# Patient Record
Sex: Female | Born: 1995 | Race: White | Hispanic: No | Marital: Single | Smoking: Never smoker
Health system: Southern US, Community
[De-identification: ages and names within clinical notes are randomized; demographics above are authoritative.]

## PROBLEM LIST (undated history)

## (undated) DIAGNOSIS — N200 Calculus of kidney: Secondary | ICD-10-CM

## (undated) HISTORY — PX: CLEFT PALATE REPAIR: SUR1165

## (undated) HISTORY — PX: SKIN GRAFT: SHX250

## (undated) HISTORY — PX: INNER EAR SURGERY: SHX679

---

## 2020-02-02 ENCOUNTER — Other Ambulatory Visit: Payer: Self-pay

## 2020-02-02 ENCOUNTER — Emergency Department (HOSPITAL_BASED_OUTPATIENT_CLINIC_OR_DEPARTMENT_OTHER): Payer: No Typology Code available for payment source

## 2020-02-02 ENCOUNTER — Encounter (HOSPITAL_BASED_OUTPATIENT_CLINIC_OR_DEPARTMENT_OTHER): Payer: Self-pay | Admitting: Emergency Medicine

## 2020-02-02 ENCOUNTER — Emergency Department (HOSPITAL_BASED_OUTPATIENT_CLINIC_OR_DEPARTMENT_OTHER)
Admission: EM | Admit: 2020-02-02 | Discharge: 2020-02-02 | Disposition: A | Discharge status: Home / Self Care | Payer: No Typology Code available for payment source | Attending: Emergency Medicine | Admitting: Emergency Medicine

## 2020-02-02 DIAGNOSIS — N200 Calculus of kidney: Secondary | ICD-10-CM | POA: Insufficient documentation

## 2020-02-02 DIAGNOSIS — R1031 Right lower quadrant pain: Secondary | ICD-10-CM | POA: Insufficient documentation

## 2020-02-02 DIAGNOSIS — R109 Unspecified abdominal pain: Secondary | ICD-10-CM | POA: Diagnosis present

## 2020-02-02 HISTORY — DX: Calculus of kidney: N20.0

## 2020-02-02 LAB — URINALYSIS, ROUTINE W REFLEX MICROSCOPIC
Bilirubin Urine: NEGATIVE
Glucose, UA: NEGATIVE mg/dL
Ketones, ur: 15 mg/dL — AB
Leukocytes,Ua: NEGATIVE
Nitrite: NEGATIVE
Protein, ur: NEGATIVE mg/dL
Specific Gravity, Urine: 1.02 (ref 1.005–1.030)
pH: 7 (ref 5.0–8.0)

## 2020-02-02 LAB — COMPREHENSIVE METABOLIC PANEL
ALT: 13 U/L (ref 0–44)
AST: 20 U/L (ref 15–41)
Albumin: 4.4 g/dL (ref 3.5–5.0)
Alkaline Phosphatase: 63 U/L (ref 38–126)
Anion gap: 9 (ref 5–15)
BUN: 6 mg/dL (ref 6–20)
CO2: 25 mmol/L (ref 22–32)
Calcium: 9.2 mg/dL (ref 8.9–10.3)
Chloride: 104 mmol/L (ref 98–111)
Creatinine, Ser: 0.67 mg/dL (ref 0.44–1.00)
GFR calc Af Amer: >60 mL/min (ref >60)
GFR calc non Af Amer: >60 mL/min (ref >60)
Glucose, Bld: 120 mg/dL — ABNORMAL HIGH (ref 70–99)
Potassium: 4.1 mmol/L (ref 3.5–5.1)
Sodium: 138 mmol/L (ref 135–145)
Total Bilirubin: 0.5 mg/dL (ref 0.3–1.2)
Total Protein: 7.4 g/dL (ref 6.5–8.1)

## 2020-02-02 LAB — CBC
HCT: 38.9 % (ref 36.0–46.0)
Hemoglobin: 12.3 g/dL (ref 12.0–15.0)
MCH: 26.5 pg (ref 26.0–34.0)
MCHC: 31.6 g/dL (ref 30.0–36.0)
MCV: 83.8 fL (ref 80.0–100.0)
Platelets: 296 10*3/uL (ref 150–400)
RBC: 4.64 MIL/uL (ref 3.87–5.11)
RDW: 14.7 % (ref 11.5–15.5)
WBC: 12.7 10*3/uL — ABNORMAL HIGH (ref 4.0–10.5)
nRBC: 0 % (ref 0.0–0.2)

## 2020-02-02 LAB — URINALYSIS, MICROSCOPIC (REFLEX): RBC / HPF: >50 RBC/hpf (ref 0–5)

## 2020-02-02 LAB — LIPASE, BLOOD: Lipase: 30 U/L (ref 11–51)

## 2020-02-02 LAB — PREGNANCY, URINE: Preg Test, Ur: NEGATIVE

## 2020-02-02 MED ORDER — MORPHINE SULFATE (PF) 4 MG/ML IV SOLN
4.0000 mg | Freq: Once | INTRAVENOUS | Status: AC
  Administered 2020-02-02: 4 mg via INTRAVENOUS
  Filled 2020-02-02: qty 1

## 2020-02-02 MED ORDER — MORPHINE SULFATE 15 MG PO TABS: 7.5000 mg | ORAL_TABLET | ORAL | 0 refills | Status: AC | PRN

## 2020-02-02 MED ORDER — ONDANSETRON HCL 4 MG/2ML IJ SOLN: 4.0000 mg | Freq: Once | INTRAMUSCULAR | Status: DC

## 2020-02-02 MED ORDER — TAMSULOSIN HCL 0.4 MG PO CAPS: 0.4000 mg | ORAL_CAPSULE | Freq: Every day | ORAL | 0 refills | Status: AC

## 2020-02-02 MED ORDER — SODIUM CHLORIDE 0.9 % IV BOLUS
1000.0000 mL | Freq: Once | INTRAVENOUS | Status: AC
  Administered 2020-02-02: 1000 mL via INTRAVENOUS

## 2020-02-02 MED ORDER — ONDANSETRON HCL 4 MG/2ML IJ SOLN
4.0000 mg | Freq: Once | INTRAMUSCULAR | Status: AC
  Administered 2020-02-02: 4 mg via INTRAVENOUS
  Filled 2020-02-02: qty 2

## 2020-02-02 MED ORDER — ONDANSETRON 4 MG PO TBDP: ORAL_TABLET | ORAL | 0 refills | Status: AC

## 2020-02-02 MED ORDER — KETOROLAC TROMETHAMINE 15 MG/ML IJ SOLN
15.0000 mg | Freq: Once | INTRAMUSCULAR | Status: AC
  Administered 2020-02-02: 15 mg via INTRAVENOUS
  Filled 2020-02-02: qty 1

## 2020-02-02 MED ORDER — ONDANSETRON HCL 4 MG/2ML IJ SOLN
4.0000 mg | Freq: Once | INTRAMUSCULAR | Status: AC | PRN
  Administered 2020-02-02: 4 mg via INTRAVENOUS
  Filled 2020-02-02: qty 2

## 2020-02-02 MED FILL — MORPHINE SULFATE IR 15 MG T: 15 | 3 days supply | Qty: 5 | Fill #0

## 2020-02-02 MED FILL — ONDANSETRON ODT 4 MG TABLET: 4 | 3 days supply | Qty: 20 | Fill #0

## 2020-02-02 MED FILL — TAMSULOSIN HCL 0.4 MG CAP: 0.4 | 30 days supply | Qty: 30 | Fill #0

## 2020-02-02 NOTE — ED Provider Notes (Signed)
[facility] EMERGENCY DEPARTMENT Provider Note   CSN: [id number] Arrival date & time: 02/02/20  1142     History Chief Complaint  Patient presents with  . Abdominal Pain    Leslie Meyer is a 24 y.o. female.  24 yo F with chief complaints of right lower quadrant abdominal pain.  Started this morning.  Constant unrelenting.  Patient has a history of kidney stones and thinks this feels similar.  Having some nausea and vomiting with it.  No fevers.  Has had 1 stone prior that she passed without intervention.  The history is provided by the patient.  Abdominal Pain Pain location:  RLQ Pain quality: cramping, sharp and shooting   Pain radiates to:  Does not radiate Pain severity:  Moderate Onset quality:  Sudden Duration:  1 day Timing:  Constant Progression:  Worsening Chronicity:  New Relieved by:  Nothing Worsened by:  Nothing Ineffective treatments:  None tried Associated symptoms: nausea and vomiting   Associated symptoms: no chest pain, no chills, no dysuria, no fever and no shortness of breath        Past Medical History:  Diagnosis Date  . Kidney stones     There are no problems to display for this patient.   Past Surgical History:  Procedure Laterality Date  . CLEFT PALATE REPAIR    . INNER EAR SURGERY    . SKIN GRAFT       OB History   No obstetric history on file.     No family history on file.  Social History   Tobacco Use  . Smoking status: Never Smoker  . Smokeless tobacco: Never Used  Substance Use Topics  . Alcohol use: Not Currently  . Drug use: Never    Home Medications Prior to Admission medications   Medication Sig Start Date End Date Taking? Authorizing Provider  morphine (MSIR) 15 MG tablet Take 0.5 tablets (7.5 mg total) by mouth every 4 (four) hours as needed for severe pain. 02/02/20   Melene Plan, DO  ondansetron (ZOFRAN ODT) 4 MG disintegrating tablet 4mg  ODT q4 hours prn nausea/vomit 02/02/20   02/04/20, DO    tamsulosin (FLOMAX) 0.4 MG CAPS capsule Take 1 capsule (0.4 mg total) by mouth daily after supper. 02/02/20   02/04/20, DO    Allergies    Lorabid [loracarbef] and Versed [midazolam]  Review of Systems   Review of Systems  Constitutional: Negative for chills and fever.  HENT: Negative for congestion and rhinorrhea.   Eyes: Negative for redness and visual disturbance.  Respiratory: Negative for shortness of breath and wheezing.   Cardiovascular: Negative for chest pain and palpitations.  Gastrointestinal: Positive for abdominal pain, nausea and vomiting.  Genitourinary: Negative for dysuria and urgency.  Musculoskeletal: Negative for arthralgias and myalgias.  Skin: Negative for pallor and wound.  Neurological: Negative for dizziness and headaches.    Physical Exam Updated Vital Signs BP 119/83 (BP Location: Right Arm)   Pulse 98   Temp 97.9 F (36.6 C) (Oral)   Resp 18   Ht 5\' 3"  (1.6 m)   Wt 79.9 kg   SpO2 100%   BMI 31.19 kg/m   Physical Exam Vitals and nursing note reviewed.  Constitutional:      General: She is not in acute distress.    Appearance: She is well-developed. She is not diaphoretic.  HENT:     Head: Normocephalic and atraumatic.  Eyes:     Pupils: Pupils are equal, round,  and reactive to light.  Cardiovascular:     Rate and Rhythm: Normal rate and regular rhythm.     Heart sounds: No murmur. No friction rub. No gallop.   Pulmonary:     Effort: Pulmonary effort is normal.     Breath sounds: No wheezing or rales.  Abdominal:     General: There is no distension.     Palpations: Abdomen is soft.     Tenderness: There is abdominal tenderness in the right lower quadrant. There is no guarding or rebound. Negative signs include Murphy's sign and Rovsing's sign.  Musculoskeletal:        General: No tenderness.     Cervical back: Normal range of motion and neck supple.  Skin:    General: Skin is warm and dry.  Neurological:     Mental Status: She is  alert and oriented to person, place, and time.  Psychiatric:        Behavior: Behavior normal.     ED Results / Procedures / Treatments   Labs (all labs ordered are listed, but only abnormal results are displayed) Labs Reviewed  URINALYSIS, ROUTINE W REFLEX MICROSCOPIC - Abnormal; Notable for the following components:      Result Value   APPearance CLOUDY (*)    Hgb urine dipstick LARGE (*)    Ketones, ur 15 (*)    All other components within normal limits  COMPREHENSIVE METABOLIC PANEL - Abnormal; Notable for the following components:   Glucose, Bld 120 (*)    All other components within normal limits  CBC - Abnormal; Notable for the following components:   WBC 12.7 (*)    All other components within normal limits  URINALYSIS, MICROSCOPIC (REFLEX) - Abnormal; Notable for the following components:   Bacteria, UA RARE (*)    All other components within normal limits  PREGNANCY, URINE  LIPASE, BLOOD    EKG None  Radiology CT Renal Stone Study  Result Date: 02/02/2020 CLINICAL DATA:  Right-sided abdominal pain with nausea and vomiting EXAM: CT ABDOMEN AND PELVIS WITHOUT CONTRAST TECHNIQUE: Multidetector CT imaging of the abdomen and pelvis was performed following the standard protocol without oral or IV contrast. COMPARISON:  None. FINDINGS: Lower chest: Lung bases are clear. Hepatobiliary: No focal liver lesions are evident on this noncontrast enhanced study. The gallbladder wall is not appreciably thickened. There is no biliary duct dilatation. Pancreas: No pancreatic mass or inflammatory focus. Spleen: No splenic lesions are evident. Adrenals/Urinary Tract: Adrenals bilaterally appear normal. There is no renal mass on either side. There is moderate hydronephrosis on either side. There is a 3 mm calculus in the mid right kidney along the periphery of the right renal pelvis. There is a 1 mm calculus in the upper pole the right kidney. There are scattered 1-2 mm calculi on the left.  There is a calculus in the right ureter at the L4 level measuring 7 x 4 mm. No other ureteral calculi are evident. Urinary bladder is midline with wall thickness within normal limits. Stomach/Bowel: There is no appreciable bowel wall or mesenteric thickening. Terminal ileum appears normal. There is no evident bowel obstruction. No free air or portal venous air is appreciable. Vascular/Lymphatic: No abdominal aortic aneurysm. No vascular lesions are evident on this noncontrast enhanced study. There is no adenopathy in the abdomen or pelvis by size criteria. There are several subcentimeter lymph nodes in the right lower quadrant. Reproductive: Uterus is anteverted.  No evident pelvic mass. Other: No appendiceal region inflammation.  No abscess or ascites in the abdomen or pelvis. There is minimal fat in the umbilicus. Musculoskeletal: No blastic or lytic bone lesions. No intramuscular lesions are evident. IMPRESSION: 1. 7 x 4 mm calculus at L4 on the right with moderate hydronephrosis on the right. 2.  Small calculi in each kidney, nonobstructing. 3. Subcentimeter lymph nodes in the right lower quadrant noted. These lymph nodes may have reactive etiology secondary to the obstructing calculus on the right. In the appropriate clinical setting, these lymph nodes could also be indicative of a degree of mesenteric adenitis. Note that there is no adenopathy in the abdomen or pelvis by size criteria. 4. No bowel obstruction. No abscess in the abdomen pelvis. Appendix appears normal. Electronically Signed   By: Bretta Bang III M.D.   On: 02/02/2020 13:39    Procedures Procedures (including critical care time)  Medications Ordered in ED Medications  ondansetron (ZOFRAN) injection 4 mg (4 mg Intravenous Not Given 02/02/20 1306)  ondansetron (ZOFRAN) injection 4 mg (4 mg Intravenous Given 02/02/20 1208)  sodium chloride 0.9 % bolus 1,000 mL (0 mLs Intravenous Stopped 02/02/20 1359)  morphine 4 MG/ML injection 4 mg  (4 mg Intravenous Given 02/02/20 1236)  ketorolac (TORADOL) 15 MG/ML injection 15 mg (15 mg Intravenous Given 02/02/20 1358)  ondansetron (ZOFRAN) injection 4 mg (4 mg Intravenous Given 02/02/20 1356)    ED Course  I have reviewed the triage vital signs and the nursing notes.  Pertinent labs & imaging results that were available during my care of the patient were reviewed by me and considered in my medical decision making (see chart for details).    MDM Rules/Calculators/A&P                      24 yo F with a chief complaint of right lower quadrant abdominal pain.  Started this morning.  Having some vomiting.  Focally tender on the right lower quadrant on my exam.  Even with her history of kidney stones this feeling similar will obtain a stone study to evaluate and to exclude appendicitis.  CT scan with a large stone about midway down the ureter.  7 x 4 measured by the radiologist.  Patient's pain is significantly improved UA is without infection.  Have her follow-up with urology.  2:37 PM:  I have discussed the diagnosis/risks/treatment options with the patient and believe the pt to be eligible for discharge home to follow-up with PCP. We also discussed returning to the ED immediately if new or worsening sx occur. We discussed the sx which are most concerning (e.g., sudden worsening pain, fever, inability to tolerate by mouth) that necessitate immediate return. Medications administered to the patient during their visit and any new prescriptions provided to the patient are listed below.  Medications given during this visit Medications  ondansetron (ZOFRAN) injection 4 mg (4 mg Intravenous Not Given 02/02/20 1306)  ondansetron (ZOFRAN) injection 4 mg (4 mg Intravenous Given 02/02/20 1208)  sodium chloride 0.9 % bolus 1,000 mL (0 mLs Intravenous Stopped 02/02/20 1359)  morphine 4 MG/ML injection 4 mg (4 mg Intravenous Given 02/02/20 1236)  ketorolac (TORADOL) 15 MG/ML injection 15 mg (15 mg  Intravenous Given 02/02/20 1358)  ondansetron (ZOFRAN) injection 4 mg (4 mg Intravenous Given 02/02/20 1356)     The patient appears reasonably screen and/or stabilized for discharge and I doubt any other medical condition or other [facility] requiring further screening, evaluation, or treatment in the ED at this time prior  to discharge.   Final Clinical Impression(s) / ED Diagnoses Final diagnoses:  Nephrolithiasis    Rx / DC Orders ED Discharge Orders         Ordered    morphine (MSIR) 15 MG tablet  Every 4 hours PRN     02/02/20 1351    ondansetron (ZOFRAN ODT) 4 MG disintegrating tablet     02/02/20 1351    tamsulosin (FLOMAX) 0.4 MG CAPS capsule  Daily after supper     02/02/20 Blodgett, Robynne Roat, DO 02/02/20 1437

## 2020-02-02 NOTE — Discharge Instructions (Signed)

## 2020-02-02 NOTE — ED Triage Notes (Signed)
Pt here with N/V and RLQ pain since this morning. Has hx of kidney stones.

## 2020-02-02 NOTE — ED Notes (Signed)
ED Provider at bedside. 

## 2021-07-29 IMAGING — CT CT RENAL STONE PROTOCOL
2 of 4 series · 15 of 46 positions shown, 17 images · non-contrast
Comparison: None.

CLINICAL DATA: Right-sided abdominal pain with nausea and vomiting

EXAM:
CT ABDOMEN AND PELVIS WITHOUT CONTRAST
TECHNIQUE: Multidetector CT imaging of the abdomen and pelvis was performed
following the standard protocol without oral or IV contrast.

[Series 2: axial st · axial · 0.82mm/px · z∈[-439,-34]mm · 12 of 91 slices shown, 14 images]
[im 5/91  soft-tissue]
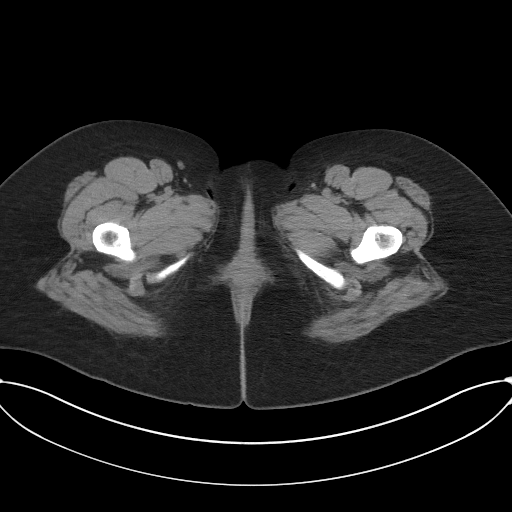
[im 5/91  bone]
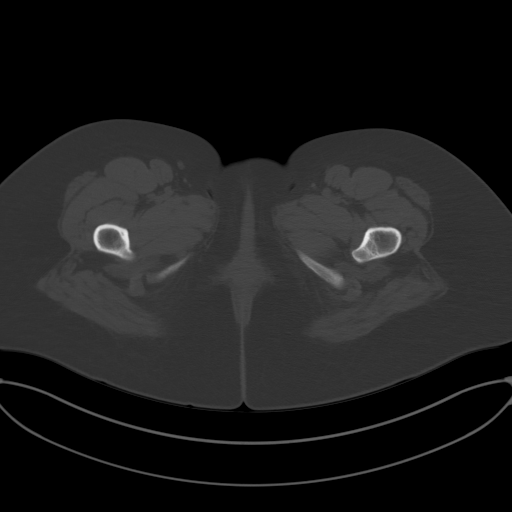
[im 13/91  soft-tissue]
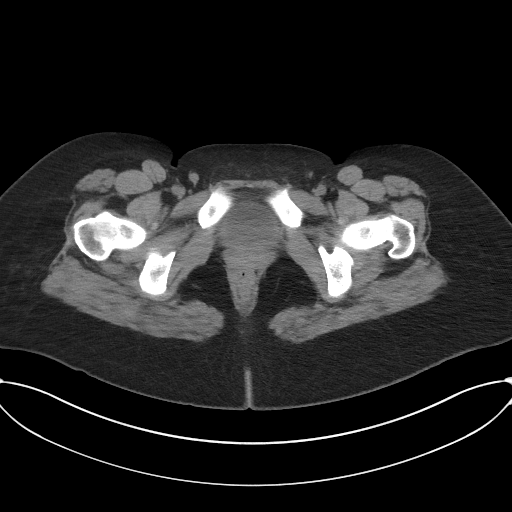
[im 21/91  soft-tissue]
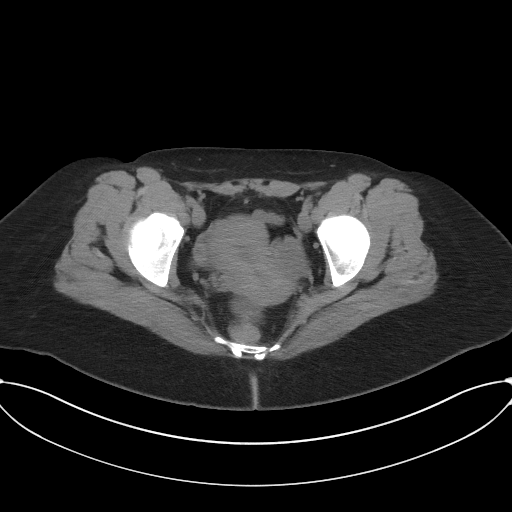
[im 29/91  soft-tissue]
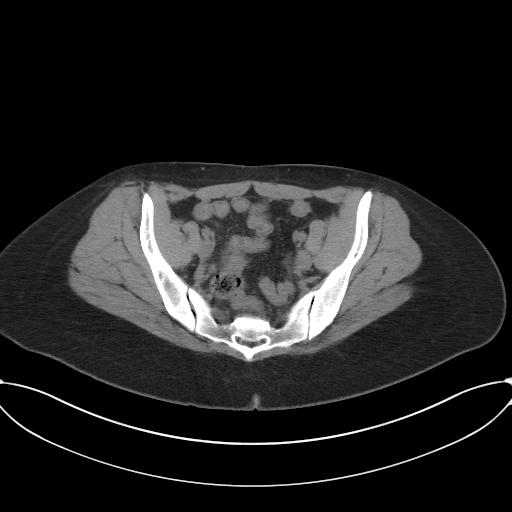
[im 33/91  soft-tissue]
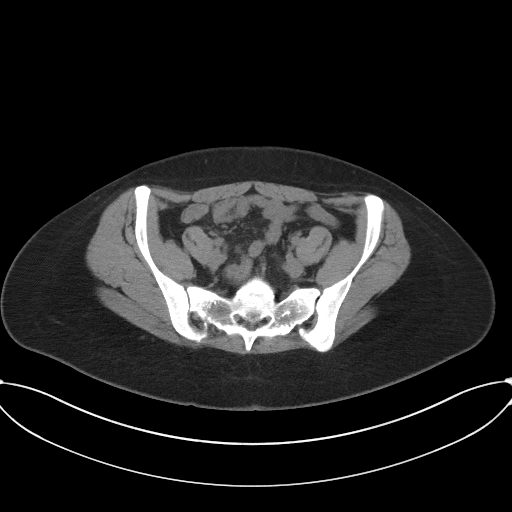
[im 41/91  soft-tissue]
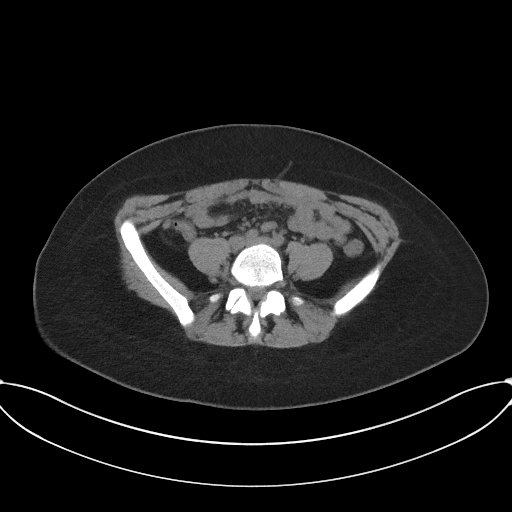
[im 50/91  soft-tissue]
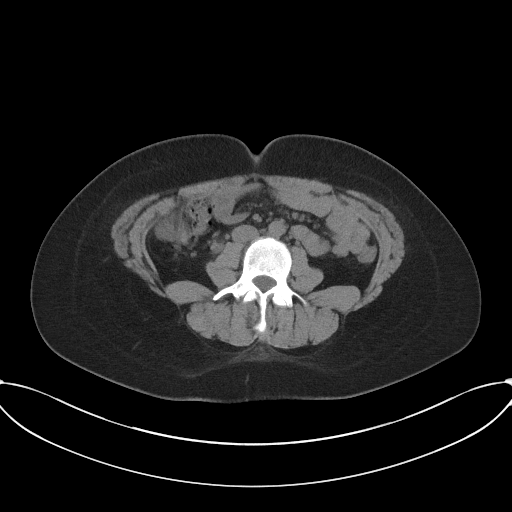
[im 58/91  soft-tissue]
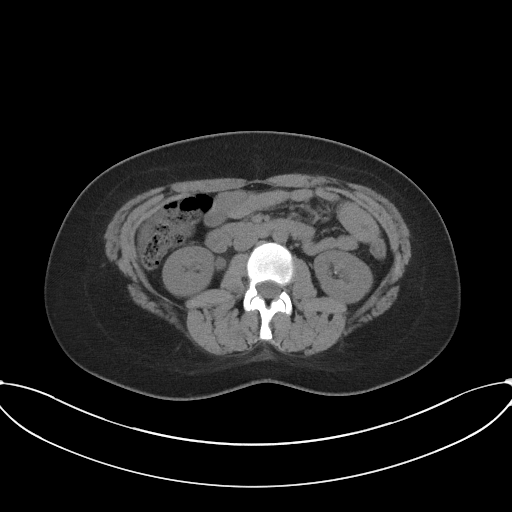
[im 62/91  soft-tissue]
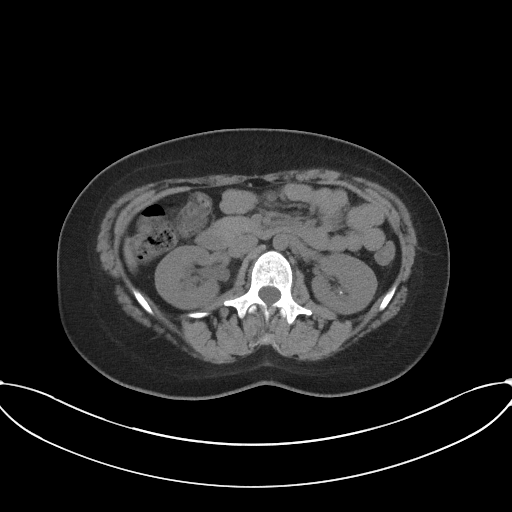
[im 62/91  bone]
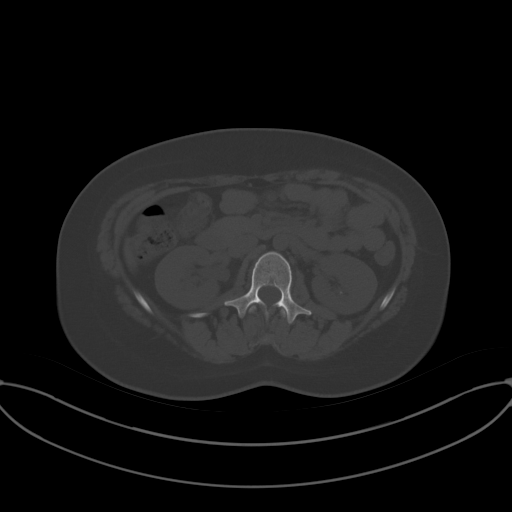
[im 70/91  soft-tissue]
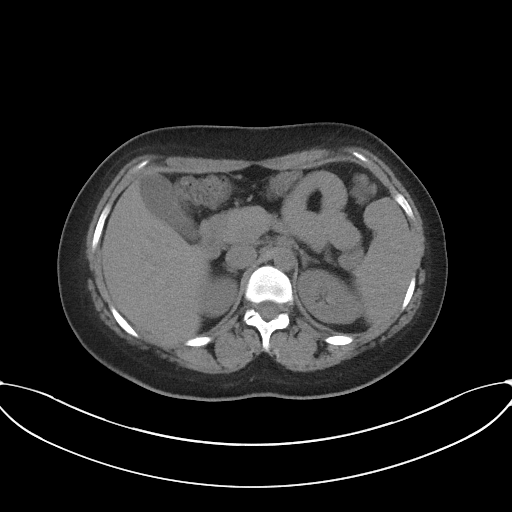
[im 78/91  soft-tissue]
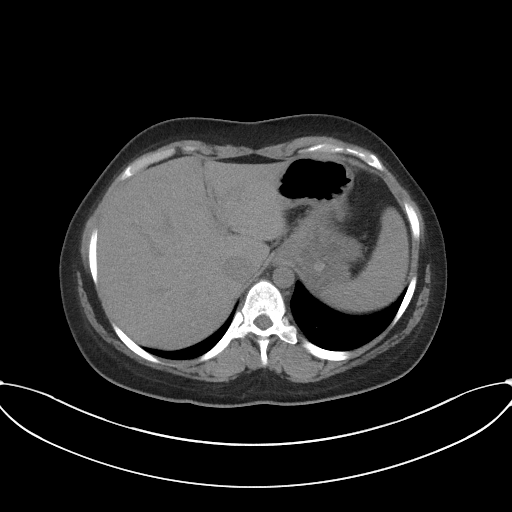
[im 86/91  soft-tissue]
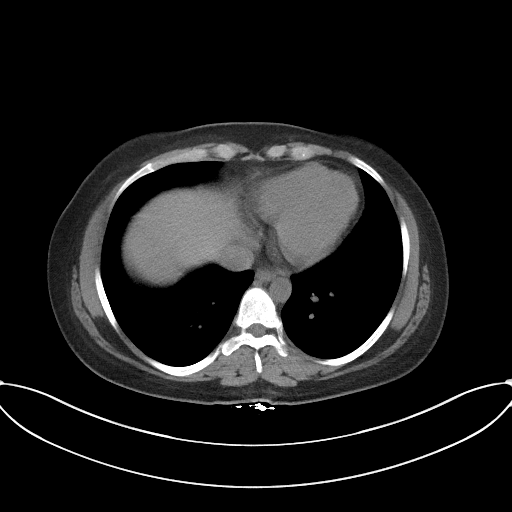

[Series 5: coronal st · coronal · 0.72mm/px · 3 of 79 slices shown]
[im 27/79  soft-tissue]
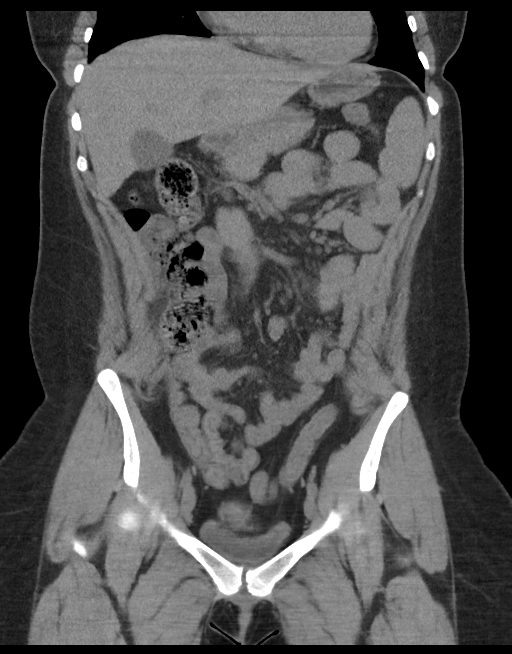
[im 35/79  soft-tissue]
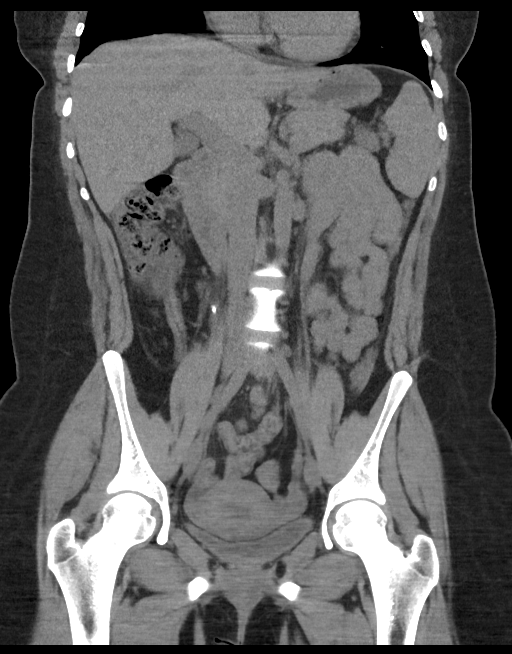
[im 44/79  soft-tissue]
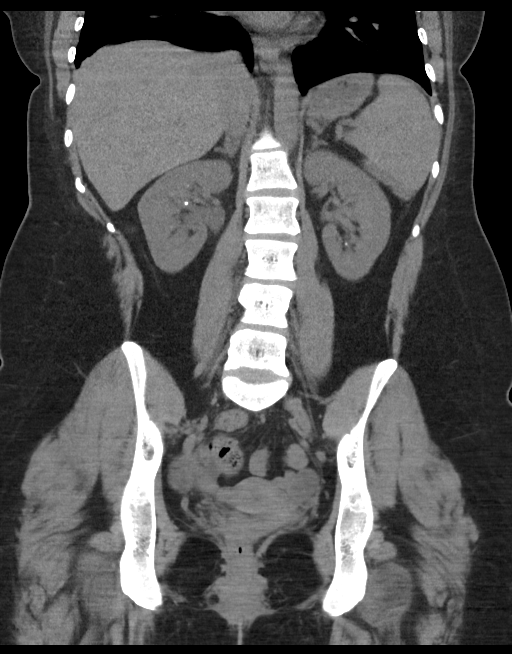

[15 of 46 positions shown; findings below may reference images not displayed]

FINDINGS: Lower chest: Lung bases are clear.

Hepatobiliary: No focal liver lesions are evident on this
noncontrast enhanced study. The gallbladder wall is not appreciably
thickened. There is no biliary duct dilatation.

Pancreas: No pancreatic mass or inflammatory focus.

Spleen: No splenic lesions are evident.

Adrenals/Urinary Tract: Adrenals bilaterally appear normal. There is
no renal mass on either side. There is moderate hydronephrosis on
either side. There is a 3 mm calculus in the mid right kidney along
the periphery of the right renal pelvis. There is a 1 mm calculus in
the upper pole the right kidney. There are scattered 1-2 mm calculi
on the left. There is a calculus in the right ureter at the L4 level
measuring 7 x 4 mm. No other ureteral calculi are evident. Urinary
bladder is midline with wall thickness within normal limits.

Stomach/Bowel: There is no appreciable bowel wall or mesenteric
thickening. Terminal ileum appears normal. There is no evident bowel
obstruction. No free air or portal venous air is appreciable.

Vascular/Lymphatic: No abdominal aortic aneurysm. No vascular
lesions are evident on this noncontrast enhanced study. There is no
adenopathy in the abdomen or pelvis by size criteria. There are
several subcentimeter lymph nodes in the right lower quadrant.

Reproductive: Uterus is anteverted.  No evident pelvic mass.

Other: No appendiceal region inflammation. No abscess or ascites in
the abdomen or pelvis. There is minimal fat in the umbilicus.

Musculoskeletal: No blastic or lytic bone lesions. No intramuscular
lesions are evident.
IMPRESSION: 1. 7 x 4 mm calculus at L4 on the right with moderate hydronephrosis
on the right.

2.  Small calculi in each kidney, nonobstructing.

3. Subcentimeter lymph nodes in the right lower quadrant noted.
These lymph nodes may have reactive etiology secondary to the
obstructing calculus on the right. In the appropriate clinical
setting, these lymph nodes could also be indicative of a degree of
mesenteric adenitis. Note that there is no adenopathy in the abdomen
or pelvis by size criteria.

4. No bowel obstruction. No abscess in the abdomen pelvis. Appendix
appears normal.
